# Patient Record
Sex: Male | Born: 1955 | Race: White | Hispanic: No | Marital: Married | Smoking: Current every day smoker
Health system: Southern US, Community
[De-identification: ages and names within clinical notes are randomized; demographics above are authoritative.]

## PROBLEM LIST (undated history)

## (undated) DIAGNOSIS — E039 Hypothyroidism, unspecified: Secondary | ICD-10-CM

## (undated) DIAGNOSIS — M1990 Unspecified osteoarthritis, unspecified site: Secondary | ICD-10-CM

## (undated) HISTORY — PX: OTHER SURGICAL HISTORY: SHX169

## (undated) HISTORY — PX: CHOLECYSTECTOMY: SHX55

---

## 1998-11-13 ENCOUNTER — Ambulatory Visit (HOSPITAL_BASED_OUTPATIENT_CLINIC_OR_DEPARTMENT_OTHER): Admission: RE | Admit: 1998-11-13 | Discharge: 1998-11-13 | Payer: Self-pay | Admitting: Orthopedic Surgery

## 2005-05-28 ENCOUNTER — Emergency Department (HOSPITAL_COMMUNITY): Admission: EM | Admit: 2005-05-28 | Discharge: 2005-05-29 | Payer: Self-pay | Admitting: Emergency Medicine

## 2005-06-12 ENCOUNTER — Ambulatory Visit (HOSPITAL_COMMUNITY): Admission: RE | Admit: 2005-06-12 | Discharge: 2005-06-12 | Payer: Self-pay | Admitting: Cardiology

## 2005-11-17 ENCOUNTER — Emergency Department (HOSPITAL_COMMUNITY): Admission: EM | Admit: 2005-11-17 | Discharge: 2005-11-17 | Payer: Self-pay | Admitting: Emergency Medicine

## 2005-12-21 ENCOUNTER — Ambulatory Visit (HOSPITAL_COMMUNITY): Admission: RE | Admit: 2005-12-21 | Discharge: 2005-12-22 | Payer: Self-pay | Admitting: General Surgery

## 2011-09-07 ENCOUNTER — Other Ambulatory Visit: Payer: Self-pay | Admitting: Critical Care Medicine

## 2011-09-07 DIAGNOSIS — J90 Pleural effusion, not elsewhere classified: Secondary | ICD-10-CM

## 2011-09-10 ENCOUNTER — Other Ambulatory Visit (HOSPITAL_COMMUNITY): Payer: Self-pay

## 2013-12-20 ENCOUNTER — Other Ambulatory Visit (HOSPITAL_COMMUNITY): Payer: Self-pay | Admitting: Orthopaedic Surgery

## 2013-12-22 ENCOUNTER — Encounter (HOSPITAL_COMMUNITY): Payer: Self-pay | Admitting: Pharmacy Technician

## 2013-12-27 ENCOUNTER — Encounter (HOSPITAL_COMMUNITY)
Admission: RE | Admit: 2013-12-27 | Discharge: 2013-12-27 | Disposition: A | Discharge status: Home / Self Care | Payer: BC Managed Care – PPO | Source: Ambulatory Visit | Attending: Orthopaedic Surgery | Admitting: Orthopaedic Surgery

## 2013-12-27 ENCOUNTER — Encounter (INDEPENDENT_AMBULATORY_CARE_PROVIDER_SITE_OTHER): Payer: Self-pay

## 2013-12-27 ENCOUNTER — Encounter (HOSPITAL_COMMUNITY): Payer: Self-pay

## 2013-12-27 HISTORY — DX: Hypothyroidism, unspecified: E03.9

## 2013-12-27 HISTORY — DX: Unspecified osteoarthritis, unspecified site: M19.90

## 2013-12-27 LAB — URINALYSIS, ROUTINE W REFLEX MICROSCOPIC
Bilirubin Urine: NEGATIVE
Glucose, UA: NEGATIVE mg/dL
Ketones, ur: NEGATIVE mg/dL
LEUKOCYTES UA: NEGATIVE
NITRITE: NEGATIVE
Protein, ur: NEGATIVE mg/dL
SPECIFIC GRAVITY, URINE: 1.015 (ref 1.005–1.030)
UROBILINOGEN UA: 0.2 mg/dL (ref 0.0–1.0)
pH: 6 (ref 5.0–8.0)

## 2013-12-27 LAB — CBC
HCT: 47.9 % (ref 39.0–52.0)
Hemoglobin: 16.6 g/dL (ref 13.0–17.0)
MCH: 33.5 pg (ref 26.0–34.0)
MCHC: 34.7 g/dL (ref 30.0–36.0)
MCV: 96.6 fL (ref 78.0–100.0)
PLATELETS: 230 10*3/uL (ref 150–400)
RBC: 4.96 MIL/uL (ref 4.22–5.81)
RDW: 14.3 % (ref 11.5–15.5)
WBC: 10.1 10*3/uL (ref 4.0–10.5)

## 2013-12-27 LAB — URINE MICROSCOPIC-ADD ON

## 2013-12-27 LAB — PROTIME-INR
INR: 1.01 (ref 0.00–1.49)
PROTHROMBIN TIME: 13.1 s (ref 11.6–15.2)

## 2013-12-27 LAB — SURGICAL PCR SCREEN
MRSA, PCR: NEGATIVE
STAPHYLOCOCCUS AUREUS: NEGATIVE

## 2013-12-27 LAB — APTT: APTT: 29 s (ref 24–37)

## 2013-12-27 LAB — BASIC METABOLIC PANEL
BUN: 14 mg/dL (ref 6–23)
CALCIUM: 9.6 mg/dL (ref 8.4–10.5)
CHLORIDE: 100 meq/L (ref 96–112)
CO2: 26 mEq/L (ref 19–32)
CREATININE: 1.02 mg/dL (ref 0.50–1.35)
GFR calc non Af Amer: 80 mL/min — ABNORMAL LOW (ref >90)
Glucose, Bld: 88 mg/dL (ref 70–99)
Potassium: 4.6 mEq/L (ref 3.7–5.3)
SODIUM: 136 meq/L — AB (ref 137–147)

## 2013-12-27 LAB — ABO/RH: ABO/RH(D): A POS

## 2013-12-27 NOTE — Pre-Procedure Instructions (Signed)
EKG AND CXR WERE NOT NEEDED PREOP PER ANESTHESIOLOGIST'S GUIDELINES. 

## 2013-12-27 NOTE — Patient Instructions (Addendum)
[facility]  ON:  Friday  1/16   REPORT TO  SHORT STAY CENTER AT:  12:45 PM      PHONE # FOR SHORT STAY IS [phone]  DO NOT EAT ANYTHING AFTER MIDNIGHT THE NIGHT BEFORE YOUR SURGERY.  YOU MAY BRUSH YOUR TEETH, RINSE OUT YOUR MOUTH-- NO FOOD, NO CHEWING GUM, NO MINTS, NO CANDIES, NO CHEWING TOBACCO. YOU MAY HAVE CLEAR LIQUIDS TO DRINK FROM MIDNIGHT UNTIL 9:15 AM DAY OF SURGERY - LIKE WATER, COFFEE ( NO MILK OR MILK PRODUCTS ).  NOTHING TO DRINK AFTER 9:15 AM THE DAY OF SURGERY.  PLEASE TAKE THE FOLLOWING MEDICATIONS THE AM OF YOUR SURGERY WITH A FEW SIPS OF WATER:  LEVOTHYROXINE, HYDROCODONE / ACETAMINOPHEN FOR PAIN IF NEEDED.   DO NOT BRING VALUABLES, MONEY, CREDIT CARDS.  DO NOT WEAR JEWELRY, MAKE-UP, NAIL POLISH AND NO METAL PINS OR CLIPS IN YOUR HAIR. CONTACT LENS, DENTURES / PARTIALS, GLASSES SHOULD NOT BE WORN TO SURGERY AND IN MOST CASES-HEARING AIDS WILL NEED TO BE REMOVED.  BRING YOUR GLASSES CASE, ANY EQUIPMENT NEEDED FOR YOUR CONTACT LENS. FOR PATIENTS ADMITTED TO THE HOSPITAL--CHECK OUT TIME THE DAY OF DISCHARGE IS 11:00 AM.  ALL INPATIENT ROOMS ARE PRIVATE - WITH BATHROOM, TELEPHONE, TELEVISION AND WIFI INTERNET.                                                     PLEASE READ OVER ANY  FACT SHEETS THAT YOU WERE GIVEN: MRSA INFORMATION, BLOOD TRANSFUSION INFORMATION.  FAILURE TO FOLLOW THESE INSTRUCTIONS MAY RESULT IN THE CANCELLATION OF YOUR SURGERY. PLEASE BE AWARE THAT YOU MAY NEED ADDITIONAL BLOOD DRAWN DAY OF YOUR SURGERY  PATIENT SIGNATURE_________________________________

## 2013-12-29 ENCOUNTER — Encounter (HOSPITAL_COMMUNITY): Payer: BC Managed Care – PPO | Admitting: Anesthesiology

## 2013-12-29 ENCOUNTER — Encounter (HOSPITAL_COMMUNITY): Payer: Self-pay | Admitting: *Deleted

## 2013-12-29 ENCOUNTER — Inpatient Hospital Stay (HOSPITAL_COMMUNITY): Payer: BC Managed Care – PPO

## 2013-12-29 ENCOUNTER — Inpatient Hospital Stay (HOSPITAL_COMMUNITY)
Admission: RE | Admit: 2013-12-29 | Discharge: 2013-12-31 | DRG: 470 | Disposition: A | Discharge status: Home Health | Payer: BC Managed Care – PPO | Source: Ambulatory Visit | Attending: Orthopaedic Surgery | Admitting: Orthopaedic Surgery

## 2013-12-29 ENCOUNTER — Inpatient Hospital Stay (HOSPITAL_COMMUNITY): Payer: BC Managed Care – PPO | Admitting: Anesthesiology

## 2013-12-29 ENCOUNTER — Encounter (HOSPITAL_COMMUNITY)
Admission: RE | Disposition: A | Discharge status: Home Health | Payer: Self-pay | Source: Ambulatory Visit | Attending: Orthopaedic Surgery

## 2013-12-29 DIAGNOSIS — Z96659 Presence of unspecified artificial knee joint: Secondary | ICD-10-CM

## 2013-12-29 DIAGNOSIS — Z01812 Encounter for preprocedural laboratory examination: Secondary | ICD-10-CM

## 2013-12-29 DIAGNOSIS — M179 Osteoarthritis of knee, unspecified: Secondary | ICD-10-CM

## 2013-12-29 DIAGNOSIS — E039 Hypothyroidism, unspecified: Secondary | ICD-10-CM | POA: Diagnosis present

## 2013-12-29 DIAGNOSIS — M12569 Traumatic arthropathy, unspecified knee: Secondary | ICD-10-CM | POA: Diagnosis present

## 2013-12-29 DIAGNOSIS — M1710 Unilateral primary osteoarthritis, unspecified knee: Secondary | ICD-10-CM

## 2013-12-29 DIAGNOSIS — F17200 Nicotine dependence, unspecified, uncomplicated: Secondary | ICD-10-CM | POA: Diagnosis present

## 2013-12-29 HISTORY — PX: TOTAL KNEE ARTHROPLASTY: SHX125

## 2013-12-29 LAB — TYPE AND SCREEN
ABO/RH(D): A POS
ANTIBODY SCREEN: NEGATIVE

## 2013-12-29 SURGERY — ARTHROPLASTY, KNEE, TOTAL
Anesthesia: Spinal | Site: Knee | Laterality: Left

## 2013-12-29 MED ORDER — ASPIRIN EC 325 MG PO TBEC
325.0000 mg | DELAYED_RELEASE_TABLET | Freq: Two times a day (BID) | ORAL | Status: DC
  Administered 2013-12-30 – 2013-12-31 (×3): 325 mg via ORAL
  Filled 2013-12-29 (×5): qty 1

## 2013-12-29 MED ORDER — PROPOFOL 10 MG/ML IV BOLUS
INTRAVENOUS | Status: AC
  Filled 2013-12-29: qty 20

## 2013-12-29 MED ORDER — ROPIVACAINE HCL 5 MG/ML IJ SOLN
INTRAMUSCULAR | Status: AC
  Filled 2013-12-29: qty 30

## 2013-12-29 MED ORDER — MENTHOL 3 MG MT LOZG
1.0000 | LOZENGE | OROMUCOSAL | Status: DC | PRN
  Filled 2013-12-29: qty 9

## 2013-12-29 MED ORDER — HYDROMORPHONE HCL PF 1 MG/ML IJ SOLN
1.0000 mg | INTRAMUSCULAR | Status: DC | PRN
  Administered 2013-12-29 – 2013-12-30 (×4): 1 mg via INTRAVENOUS
  Filled 2013-12-29 (×4): qty 1

## 2013-12-29 MED ORDER — BUPIVACAINE HCL (PF) 0.75 % IJ SOLN
INTRAMUSCULAR | Status: DC | PRN
  Administered 2013-12-29: 15 mg via INTRATHECAL

## 2013-12-29 MED ORDER — SODIUM CHLORIDE 0.9 % IR SOLN
Status: DC | PRN
  Administered 2013-12-29: 4000 mL

## 2013-12-29 MED ORDER — GLYCOPYRROLATE 0.2 MG/ML IJ SOLN
INTRAMUSCULAR | Status: AC
  Filled 2013-12-29: qty 3

## 2013-12-29 MED ORDER — ACETAMINOPHEN 325 MG PO TABS: 650.0000 mg | ORAL_TABLET | Freq: Four times a day (QID) | ORAL | Status: DC | PRN

## 2013-12-29 MED ORDER — CEFAZOLIN SODIUM-DEXTROSE 2-3 GM-% IV SOLR
2.0000 g | INTRAVENOUS | Status: AC
  Administered 2013-12-29: 2 g via INTRAVENOUS

## 2013-12-29 MED ORDER — HYDROMORPHONE HCL PF 1 MG/ML IJ SOLN
0.2500 mg | INTRAMUSCULAR | Status: DC | PRN
  Administered 2013-12-29: 0.5 mg via INTRAVENOUS

## 2013-12-29 MED ORDER — METHOCARBAMOL 500 MG PO TABS
500.0000 mg | ORAL_TABLET | Freq: Four times a day (QID) | ORAL | Status: DC | PRN
  Administered 2013-12-30 – 2013-12-31 (×4): 500 mg via ORAL
  Filled 2013-12-29 (×4): qty 1

## 2013-12-29 MED ORDER — PHENOL 1.4 % MT LIQD: 1.0000 | OROMUCOSAL | Status: DC | PRN

## 2013-12-29 MED ORDER — MIDAZOLAM HCL 2 MG/2ML IJ SOLN
INTRAMUSCULAR | Status: AC
  Filled 2013-12-29: qty 2

## 2013-12-29 MED ORDER — LACTATED RINGERS IV SOLN
INTRAVENOUS | Status: DC | PRN
  Administered 2013-12-29 (×2): via INTRAVENOUS

## 2013-12-29 MED ORDER — FENTANYL CITRATE 0.05 MG/ML IJ SOLN
INTRAMUSCULAR | Status: DC | PRN
  Administered 2013-12-29: 50 ug via INTRAVENOUS
  Administered 2013-12-29 (×4): 25 ug via INTRAVENOUS
  Administered 2013-12-29 (×2): 50 ug via INTRAVENOUS

## 2013-12-29 MED ORDER — POLYETHYLENE GLYCOL 3350 17 G PO PACK
17.0000 g | PACK | Freq: Every day | ORAL | Status: DC | PRN
  Administered 2013-12-30: 17 g via ORAL

## 2013-12-29 MED ORDER — CEFAZOLIN SODIUM-DEXTROSE 2-3 GM-% IV SOLR
INTRAVENOUS | Status: AC
  Filled 2013-12-29: qty 50

## 2013-12-29 MED ORDER — LACTATED RINGERS IV SOLN: INTRAVENOUS | Status: DC

## 2013-12-29 MED ORDER — KETAMINE HCL 10 MG/ML IJ SOLN
INTRAMUSCULAR | Status: DC | PRN
  Administered 2013-12-29: 10 mg via INTRAVENOUS
  Administered 2013-12-29: 15 mg via INTRAVENOUS
  Administered 2013-12-29: 10 mg via INTRAVENOUS

## 2013-12-29 MED ORDER — 0.9 % SODIUM CHLORIDE (POUR BTL) OPTIME
TOPICAL | Status: DC | PRN
  Administered 2013-12-29: 1000 mL

## 2013-12-29 MED ORDER — METOCLOPRAMIDE HCL 5 MG/ML IJ SOLN: 5.0000 mg | Freq: Three times a day (TID) | INTRAMUSCULAR | Status: DC | PRN

## 2013-12-29 MED ORDER — ACETAMINOPHEN 650 MG RE SUPP: 650.0000 mg | Freq: Four times a day (QID) | RECTAL | Status: DC | PRN

## 2013-12-29 MED ORDER — EPHEDRINE SULFATE 50 MG/ML IJ SOLN
INTRAMUSCULAR | Status: AC
  Filled 2013-12-29: qty 1

## 2013-12-29 MED ORDER — LEVOTHYROXINE SODIUM 200 MCG PO TABS
200.0000 ug | ORAL_TABLET | Freq: Every day | ORAL | Status: DC
  Administered 2013-12-30 – 2013-12-31 (×2): 200 ug via ORAL
  Filled 2013-12-29 (×3): qty 1

## 2013-12-29 MED ORDER — OXYCODONE HCL ER 10 MG PO T12A
10.0000 mg | EXTENDED_RELEASE_TABLET | Freq: Two times a day (BID) | ORAL | Status: DC
  Administered 2013-12-30 – 2013-12-31 (×3): 10 mg via ORAL
  Filled 2013-12-29 (×3): qty 1

## 2013-12-29 MED ORDER — ONDANSETRON HCL 4 MG/2ML IJ SOLN
INTRAMUSCULAR | Status: DC | PRN
  Administered 2013-12-29 (×2): 2 mg via INTRAVENOUS

## 2013-12-29 MED ORDER — PROPOFOL INFUSION 10 MG/ML OPTIME
INTRAVENOUS | Status: DC | PRN
  Administered 2013-12-29: 100 ug/kg/min via INTRAVENOUS

## 2013-12-29 MED ORDER — HYDROMORPHONE HCL PF 1 MG/ML IJ SOLN
INTRAMUSCULAR | Status: AC
  Administered 2013-12-30: 1 mg via INTRAVENOUS
  Filled 2013-12-29: qty 1

## 2013-12-29 MED ORDER — METHOCARBAMOL 100 MG/ML IJ SOLN
500.0000 mg | Freq: Four times a day (QID) | INTRAVENOUS | Status: DC | PRN
  Administered 2013-12-29: 500 mg via INTRAVENOUS
  Filled 2013-12-29: qty 5

## 2013-12-29 MED ORDER — MIDAZOLAM HCL 5 MG/5ML IJ SOLN
INTRAMUSCULAR | Status: DC | PRN
  Administered 2013-12-29 (×4): 1 mg via INTRAVENOUS

## 2013-12-29 MED ORDER — CEFAZOLIN SODIUM 1-5 GM-% IV SOLN
1.0000 g | Freq: Four times a day (QID) | INTRAVENOUS | Status: AC
  Administered 2013-12-29 – 2013-12-30 (×2): 1 g via INTRAVENOUS
  Filled 2013-12-29 (×2): qty 50

## 2013-12-29 MED ORDER — EPHEDRINE SULFATE 50 MG/ML IJ SOLN
INTRAMUSCULAR | Status: DC | PRN
  Administered 2013-12-29 (×2): 5 mg via INTRAVENOUS

## 2013-12-29 MED ORDER — FENTANYL CITRATE 0.05 MG/ML IJ SOLN
INTRAMUSCULAR | Status: AC
  Filled 2013-12-29: qty 5

## 2013-12-29 MED ORDER — LACTATED RINGERS IV SOLN
INTRAVENOUS | Status: DC
  Administered 2013-12-29: 1000 mL via INTRAVENOUS

## 2013-12-29 MED ORDER — DOCUSATE SODIUM 100 MG PO CAPS
100.0000 mg | ORAL_CAPSULE | Freq: Two times a day (BID) | ORAL | Status: DC
  Administered 2013-12-29 – 2013-12-31 (×4): 100 mg via ORAL

## 2013-12-29 MED ORDER — SODIUM CHLORIDE 0.9 % IJ SOLN
INTRAMUSCULAR | Status: AC
  Filled 2013-12-29: qty 10

## 2013-12-29 MED ORDER — ONDANSETRON HCL 4 MG/2ML IJ SOLN
INTRAMUSCULAR | Status: AC
  Filled 2013-12-29: qty 2

## 2013-12-29 MED ORDER — LIDOCAINE HCL (CARDIAC) 20 MG/ML IV SOLN
INTRAVENOUS | Status: AC
  Filled 2013-12-29: qty 5

## 2013-12-29 MED ORDER — ALUM & MAG HYDROXIDE-SIMETH 200-200-20 MG/5ML PO SUSP: 30.0000 mL | ORAL | Status: DC | PRN

## 2013-12-29 MED ORDER — ROPIVACAINE HCL 5 MG/ML IJ SOLN
INTRAMUSCULAR | Status: DC | PRN
  Administered 2013-12-29: 30 mL via PERINEURAL

## 2013-12-29 MED ORDER — ONDANSETRON HCL 4 MG PO TABS: 4.0000 mg | ORAL_TABLET | Freq: Four times a day (QID) | ORAL | Status: DC | PRN

## 2013-12-29 MED ORDER — KETAMINE HCL 10 MG/ML IJ SOLN
INTRAMUSCULAR | Status: AC
  Filled 2013-12-29: qty 1

## 2013-12-29 MED ORDER — SODIUM CHLORIDE 0.9 % IV SOLN
INTRAVENOUS | Status: DC
  Administered 2013-12-30 (×3): via INTRAVENOUS

## 2013-12-29 MED ORDER — DIPHENHYDRAMINE HCL 12.5 MG/5ML PO ELIX: 12.5000 mg | ORAL_SOLUTION | ORAL | Status: DC | PRN

## 2013-12-29 MED ORDER — OXYCODONE HCL 5 MG PO TABS
5.0000 mg | ORAL_TABLET | ORAL | Status: DC | PRN
  Administered 2013-12-30 – 2013-12-31 (×7): 10 mg via ORAL
  Filled 2013-12-29 (×7): qty 2

## 2013-12-29 MED ORDER — METOCLOPRAMIDE HCL 10 MG PO TABS: 5.0000 mg | ORAL_TABLET | Freq: Three times a day (TID) | ORAL | Status: DC | PRN

## 2013-12-29 MED ORDER — ONDANSETRON HCL 4 MG/2ML IJ SOLN
4.0000 mg | Freq: Four times a day (QID) | INTRAMUSCULAR | Status: DC | PRN
Start: 2013-12-29 — End: 2013-12-31

## 2013-12-29 MED ORDER — ZOLPIDEM TARTRATE 5 MG PO TABS: 5.0000 mg | ORAL_TABLET | Freq: Every evening | ORAL | Status: DC | PRN

## 2013-12-29 SURGICAL SUPPLY — 69 items
BAG ZIPLOCK 12X15 (MISCELLANEOUS) IMPLANT
BANDAGE ELASTIC 6 VELCRO ST LF (GAUZE/BANDAGES/DRESSINGS) ×6 IMPLANT
BANDAGE ESMARK 6X9 LF (GAUZE/BANDAGES/DRESSINGS) ×1 IMPLANT
BLADE SAG 18X100X1.27 (BLADE) ×3 IMPLANT
BLADE SAGITTAL 25.0X1.37X90 (BLADE) ×2 IMPLANT
BLADE SAGITTAL 25.0X1.37X90MM (BLADE) ×1
BNDG ESMARK 6X9 LF (GAUZE/BANDAGES/DRESSINGS) ×3
BOWL SMART MIX CTS (DISPOSABLE) ×3 IMPLANT
CAP KNEE OXINIUM POROUS CONSTR ×3 IMPLANT
CEMENT BONE 1-PACK (Cement) ×6 IMPLANT
CUFF TOURN SGL QUICK 34 (TOURNIQUET CUFF) ×2
CUFF TRNQT CYL 34X4X40X1 (TOURNIQUET CUFF) ×1 IMPLANT
DRAPE EXTREMITY T 121X128X90 (DRAPE) ×3 IMPLANT
DRAPE LG THREE QUARTER DISP (DRAPES) ×3 IMPLANT
DRAPE POUCH INSTRU U-SHP 10X18 (DRAPES) ×3 IMPLANT
DRAPE U-SHAPE 47X51 STRL (DRAPES) ×3 IMPLANT
DRSG AQUACEL AG ADV 3.5X10 (GAUZE/BANDAGES/DRESSINGS) IMPLANT
DRSG TEGADERM 4X4.75 (GAUZE/BANDAGES/DRESSINGS) IMPLANT
DURAPREP 26ML APPLICATOR (WOUND CARE) ×3 IMPLANT
ELECT REM PT RETURN 9FT ADLT (ELECTROSURGICAL) ×3
ELECTRODE REM PT RTRN 9FT ADLT (ELECTROSURGICAL) ×1 IMPLANT
EVACUATOR 1/8 PVC DRAIN (DRAIN) ×3 IMPLANT
FACESHIELD LNG OPTICON STERILE (SAFETY) ×15 IMPLANT
GAUZE SPONGE 2X2 8PLY STRL LF (GAUZE/BANDAGES/DRESSINGS) IMPLANT
GAUZE XEROFORM 1X8 LF (GAUZE/BANDAGES/DRESSINGS) ×6 IMPLANT
GLOVE BIO SURGEON STRL SZ7.5 (GLOVE) ×3 IMPLANT
GLOVE BIOGEL PI IND STRL 7.0 (GLOVE) ×1 IMPLANT
GLOVE BIOGEL PI IND STRL 8 (GLOVE) ×3 IMPLANT
GLOVE BIOGEL PI INDICATOR 7.0 (GLOVE) ×2
GLOVE BIOGEL PI INDICATOR 8 (GLOVE) ×6
GLOVE ECLIPSE 8.0 STRL XLNG CF (GLOVE) ×3 IMPLANT
GLOVE SURG SS PI 6.5 STRL IVOR (GLOVE) ×3 IMPLANT
GLOVE SURG SS PI 7.5 STRL IVOR (GLOVE) ×6 IMPLANT
GLOVE SURG SS PI 8.5 STRL IVOR (GLOVE) ×6
GLOVE SURG SS PI 8.5 STRL STRW (GLOVE) ×3 IMPLANT
GOWN STRL REIN 3XL LVL4 (GOWN DISPOSABLE) ×3 IMPLANT
GOWN STRL REUS W/ TWL LRG LVL4 (GOWN DISPOSABLE) ×1 IMPLANT
GOWN STRL REUS W/TWL LRG LVL4 (GOWN DISPOSABLE) ×2
GOWN STRL REUS W/TWL XL LVL3 (GOWN DISPOSABLE) ×12 IMPLANT
HANDPIECE INTERPULSE COAX TIP (DISPOSABLE) ×2
IMMOBILIZER KNEE 20 (SOFTGOODS) ×3 IMPLANT
IV NS IRRIG 3000ML ARTHROMATIC (IV SOLUTION) ×3 IMPLANT
KIT BASIN OR (CUSTOM PROCEDURE TRAY) ×3 IMPLANT
NEEDLE HYPO 21X1.5 SAFETY (NEEDLE) IMPLANT
NS IRRIG 1000ML POUR BTL (IV SOLUTION) ×3 IMPLANT
PACK TOTAL JOINT (CUSTOM PROCEDURE TRAY) ×3 IMPLANT
PAD ABD 8X10 STRL (GAUZE/BANDAGES/DRESSINGS) ×3 IMPLANT
PADDING CAST COTTON 6X4 STRL (CAST SUPPLIES) ×6 IMPLANT
PIN TROCAR 3 INCH (PIN) ×24 IMPLANT
POSITIONER SURGICAL ARM (MISCELLANEOUS) ×3 IMPLANT
SET HNDPC FAN SPRY TIP SCT (DISPOSABLE) ×1 IMPLANT
SET PAD KNEE POSITIONER (MISCELLANEOUS) ×3 IMPLANT
SPONGE GAUZE 2X2 STER 10/PKG (GAUZE/BANDAGES/DRESSINGS)
SPONGE GAUZE 4X4 12PLY (GAUZE/BANDAGES/DRESSINGS) ×6 IMPLANT
STAPLER VISISTAT 35W (STAPLE) ×3 IMPLANT
SUCTION FRAZIER 12FR DISP (SUCTIONS) ×3 IMPLANT
SUT MNCRL AB 4-0 PS2 18 (SUTURE) IMPLANT
SUT VIC AB 0 CT1 27 (SUTURE)
SUT VIC AB 0 CT1 27XBRD ANTBC (SUTURE) IMPLANT
SUT VIC AB 1 CT1 27 (SUTURE) ×8
SUT VIC AB 1 CT1 27XBRD ANTBC (SUTURE) ×4 IMPLANT
SUT VIC AB 2-0 CT1 27 (SUTURE) ×4
SUT VIC AB 2-0 CT1 TAPERPNT 27 (SUTURE) ×2 IMPLANT
SYR 50ML LL SCALE MARK (SYRINGE) IMPLANT
TOWEL OR 17X26 10 PK STRL BLUE (TOWEL DISPOSABLE) ×3 IMPLANT
TOWEL OR NON WOVEN STRL DISP B (DISPOSABLE) ×3 IMPLANT
TRAY FOLEY CATH 16FR SILVER (SET/KITS/TRAYS/PACK) ×3 IMPLANT
WATER STERILE IRR 1500ML POUR (IV SOLUTION) ×3 IMPLANT
WRAP KNEE MAXI GEL POST OP (GAUZE/BANDAGES/DRESSINGS) ×3 IMPLANT

## 2013-12-29 NOTE — Anesthesia Postprocedure Evaluation (Signed)
  Anesthesia Post-op Note  Patient: Luke Meyer  Procedure(s) Performed: Procedure(s) (LRB): LEFT TOTAL KNEE ARTHROPLASTY (Left)  Patient Location: PACU  Anesthesia Type: Spinal  Level of Consciousness: awake and alert   Airway and Oxygen Therapy: Patient Spontanous Breathing  Post-op Pain: mild  Post-op Assessment: Post-op Vital signs reviewed, Patient's Cardiovascular Status Stable, Respiratory Function Stable, Patent Airway and No signs of Nausea or vomiting  Last Vitals:  Filed Vitals:   12/29/13 1915  BP:   Pulse:   Temp: 36.7 C  Resp:     Post-op Vital Signs: stable   Complications: No apparent anesthesia complications

## 2013-12-29 NOTE — Anesthesia Preprocedure Evaluation (Signed)
Anesthesia Evaluation  Patient identified by MRN, date of birth, ID band Patient awake    Reviewed: Allergy & Precautions, H&P , NPO status , Patient's Chart, lab work & pertinent test results  Airway Mallampati: II TM Distance: >3 FB Neck ROM: full    Dental  (+) Caps and Dental Advisory Given Caps and bonding on both front upper teeth and a couple of other front teeth:   Pulmonary neg pulmonary ROS, Current Smoker,  breath sounds clear to auscultation  Pulmonary exam normal       Cardiovascular Exercise Tolerance: Good negative cardio ROS  Rhythm:regular Rate:Normal     Neuro/Psych negative neurological ROS  negative psych ROS   GI/Hepatic negative GI ROS, Neg liver ROS,   Endo/Other  negative endocrine ROSHypothyroidism   Renal/GU negative Renal ROS  negative genitourinary   Musculoskeletal   Abdominal   Peds  Hematology negative hematology ROS (+)   Anesthesia Other Findings   Reproductive/Obstetrics negative OB ROS                           Anesthesia Physical Anesthesia Plan  ASA: II  Anesthesia Plan: Spinal   Post-op Pain Management: MAC Combined w/ Regional for Post-op pain   Induction:   Airway Management Planned: Simple Face Mask  Additional Equipment:   Intra-op Plan:   Post-operative Plan:   Informed Consent: I have reviewed the patients History and Physical, chart, labs and discussed the procedure including the risks, benefits and alternatives for the proposed anesthesia with the patient or authorized representative who has indicated his/her understanding and acceptance.   Dental Advisory Given  Plan Discussed with: CRNA and Surgeon  Anesthesia Plan Comments:         Anesthesia Quick Evaluation

## 2013-12-29 NOTE — Brief Op Note (Signed)
12/29/2013  6:00 PM  PATIENT:  Luke Meyer  58 y.o. male  PRE-OPERATIVE DIAGNOSIS:  Severe osteoarthritis left knee  POST-OPERATIVE DIAGNOSIS:  Severe osteoarthritis left knee  PROCEDURE:  Procedure(s): LEFT TOTAL KNEE ARTHROPLASTY (Left)  SURGEON:  Surgeon(s) and Role:    * Kathryne Hitchhristopher Y Khandi Kernes, MD - Primary  PHYSICIAN ASSISTANT: Rexene EdisonGil Clark, PA-C  ANESTHESIA:   spinal  EBL:  Total I/O In: 1000 [I.V.:1000] Out: 850 [Urine:700; Blood:150]  BLOOD ADMINISTERED:none  DRAINS: (Medium) Hemovact drain(s) in the knee joint with  Suction Open   LOCAL MEDICATIONS USED:  NONE  SPECIMEN:  No Specimen  DISPOSITION OF SPECIMEN:  N/A  COUNTS:  YES  TOURNIQUET:   Total Tourniquet Time Documented: Thigh (Left) - 85 minutes Total: Thigh (Left) - 85 minutes   DICTATION: .Other Dictation: Dictation Number [id number]  PLAN OF CARE: Admit to inpatient   PATIENT DISPOSITION:  PACU - hemodynamically stable.   Delay start of Pharmacological VTE agent (>24hrs) due to surgical blood loss or risk of bleeding: no

## 2013-12-29 NOTE — Transfer of Care (Signed)
Immediate Anesthesia Transfer of Care Note  Patient: Luke Meyer  Procedure(s) Performed: Procedure(s): LEFT TOTAL KNEE ARTHROPLASTY (Left)  Patient Location: PACU  Anesthesia Type:Regional  Level of Consciousness: awake, alert , oriented and patient cooperative  Airway & Oxygen Therapy: Patient Spontanous Breathing and Patient connected to face mask oxygen  Post-op Assessment: Report given to PACU RN and Post -op Vital signs reviewed and stable  Post vital signs: stable  Complications: No apparent anesthesia complications

## 2013-12-29 NOTE — Anesthesia Procedure Notes (Addendum)
Anesthesia Regional Block:  Femoral nerve block  Pre-Anesthetic Checklist: ,, timeout performed, Correct Patient, Correct Site, Correct Laterality, Correct Procedure, Correct Position, site marked, Risks and benefits discussed,  Surgical consent,  Pre-op evaluation,  At surgeon's request and post-op pain management  Laterality: Left  Prep: chloraprep       Needles:  Injection technique: Single-shot  Needle Type: Echogenic Stimulator Needle      Needle Gauge: 20 and 20 G    Additional Needles:  Procedures: ultrasound guided (picture in chart) and nerve stimulator Femoral nerve block  Nerve Stimulator or Paresthesia:  Response: 0.5 mA,   Additional Responses:   Narrative:  Start time: 12/29/2013 2:35 PM End time: 12/29/2013 2:45 PM Injection made incrementally with aspirations every 5 mL.  Performed by: Personally  Anesthesiologist: Peyton Najjar MD  Additional Notes: Patient tolerated the procedure well without complications   Spinal  Patient location during procedure: OR Start time: 12/29/2013 3:30 PM End time: 12/29/2013 3:35 PM Staffing Anesthesiologist: Rod Mae L Performed by: anesthesiologist  Preanesthetic Checklist Completed: patient identified, site marked, surgical consent, pre-op evaluation, timeout performed, IV checked, risks and benefits discussed and monitors and equipment checked Spinal Block Patient position: sitting Prep: Betadine Patient monitoring: heart rate, continuous pulse ox and blood pressure Location: L2-3 Injection technique: single-shot Needle Needle type: Spinocan and Whitacre  Needle gauge: 25 G Needle length: 9 cm Assessment Sensory level: T6 Additional Notes Expiration date of kit checked and confirmed. Patient tolerated procedure well, without complications.
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
## 2013-12-30 LAB — BASIC METABOLIC PANEL
BUN: 11 mg/dL (ref 6–23)
CALCIUM: 8.2 mg/dL — AB (ref 8.4–10.5)
CO2: 23 meq/L (ref 19–32)
Chloride: 100 mEq/L (ref 96–112)
Creatinine, Ser: 1.05 mg/dL (ref 0.50–1.35)
GFR calc Af Amer: 89 mL/min — ABNORMAL LOW (ref >90)
GFR calc non Af Amer: 77 mL/min — ABNORMAL LOW (ref >90)
Glucose, Bld: 125 mg/dL — ABNORMAL HIGH (ref 70–99)
POTASSIUM: 4.4 meq/L (ref 3.7–5.3)
SODIUM: 135 meq/L — AB (ref 137–147)

## 2013-12-30 LAB — CBC
HCT: 37.6 % — ABNORMAL LOW (ref 39.0–52.0)
Hemoglobin: 12.5 g/dL — ABNORMAL LOW (ref 13.0–17.0)
MCH: 32.4 pg (ref 26.0–34.0)
MCHC: 33.2 g/dL (ref 30.0–36.0)
MCV: 97.4 fL (ref 78.0–100.0)
Platelets: 184 10*3/uL (ref 150–400)
RBC: 3.86 MIL/uL — AB (ref 4.22–5.81)
RDW: 14.2 % (ref 11.5–15.5)
WBC: 11 10*3/uL — ABNORMAL HIGH (ref 4.0–10.5)

## 2013-12-30 NOTE — Plan of Care (Signed)
Problem: Consults Goal: Diagnosis- Total Joint Replacement Outcome: Completed/Met Date Met:  12/30/13 Primary Total Knee LEFT

## 2013-12-30 NOTE — Progress Notes (Signed)
Physical Therapy Treatment Patient Details Name: Luke SimpsonRonald A Meyer MRN: [id number] DOB: 1956/05/14 Today's Date: 12/30/2013 Time: [id number] PT Time Calculation (min): 18 min  PT Assessment / Plan / Recommendation  History of Present Illness LTKA   PT Comments   Pt improving. Pt hopeful to DC tomorrow.  Follow Up Recommendations  Home health PT     Does the patient have the potential to tolerate intense rehabilitation     Barriers to Discharge        Equipment Recommendations  None recommended by PT    Recommendations for Other Services    Frequency 7X/week   Progress towards PT Goals Progress towards PT goals: Progressing toward goals  Plan Current plan remains appropriate    Precautions / Restrictions Precautions Precautions: Knee Required Braces or Orthoses: Knee Immobilizer - Left Knee Immobilizer - Left: Discontinue once straight leg raise with < 10 degree lag Restrictions Weight Bearing Restrictions: No   Pertinent Vitals/Pain 5-6 L knee    Mobility  Bed Mobility Overal bed mobility: Modified Independent Bed Mobility: Sit to Supine Supine to sit: Modified independent (Device/Increase time) (using sheet to help lift his leg) Sit to supine: Min assist General bed mobility comments: support of LLE Transfers Overall transfer level: Needs assistance Equipment used: Rolling walker (2 wheeled) Transfers: Sit to/from Stand Sit to Stand: Min guard General transfer comment: cues for UE position/leg position Ambulation/Gait Ambulation/Gait assistance: Min assist Ambulation Distance (Feet): 160 Feet Assistive device: Rolling walker (2 wheeled) Gait Pattern/deviations: Step-to pattern;Step-through pattern;Antalgic General Gait Details: cues for sequence.    Exercises Total Joint Exercises Heel Slides: AAROM;Left;10 reps;Supine Straight Leg Raises: AAROM;Left;10 reps;Supine   PT Diagnosis:    PT Problem List:   PT Treatment Interventions:     PT Goals (current  goals can now be found in the care plan section)    Visit Information  Last PT Received On: 12/30/13 Assistance Needed: +1 History of Present Illness: LTKA    Subjective Data      Cognition  Cognition Arousal/Alertness: Awake/alert Behavior During Therapy: WFL for tasks assessed/performed Overall Cognitive Status: Within Functional Limits for tasks assessed    Balance     End of Session PT - End of Session Equipment Utilized During Treatment: Left knee immobilizer Activity Tolerance: Patient tolerated treatment well Patient left: with call bell/phone within reach;in bed Nurse Communication: Mobility status   GP     Rada HayHill, Blimy Napoleon Elizabeth 12/30/2013, 4:54 PM

## 2013-12-30 NOTE — Evaluation (Signed)
Physical Therapy Evaluation Patient Details Name: Luke SimpsonRonald A Stamour MRN: [id number] DOB: 01/15/1956 Today's Date: 12/30/2013 Time: [id number] PT Time Calculation (min): 26 min  PT Assessment / Plan / Recommendation History of Present Illness  LTKA  Clinical Impression  Pt tolerated ambulation well. Pt will benefit from PT to address problems.   PT Assessment  Patient needs continued PT services    Follow Up Recommendations  Home health PT    Does the patient have the potential to tolerate intense rehabilitation      Barriers to Discharge        Equipment Recommendations  None recommended by PT    Recommendations for Other Services     Frequency 7X/week    Precautions / Restrictions Precautions Precautions: Knee Required Braces or Orthoses: Knee Immobilizer - Left Knee Immobilizer - Left: Discontinue once straight leg raise with < 10 degree lag   Pertinent Vitals/Pain 7 with exercises.     Mobility  Bed Mobility Overal bed mobility: Needs Assistance Bed Mobility: Supine to Sit Supine to sit: Min assist General bed mobility comments: support of LLE Transfers Overall transfer level: Needs assistance Equipment used: Rolling walker (2 wheeled) Transfers: Sit to/from Stand Sit to Stand: Min assist;From elevated surface General transfer comment: cues for UE position/leg position Ambulation/Gait Ambulation/Gait assistance: Min assist Ambulation Distance (Feet): 80 Feet Assistive device: Rolling walker (2 wheeled) Gait Pattern/deviations: Step-to pattern;Decreased step length - left;Decreased stance time - left    Exercises Total Joint Exercises Quad Sets: AROM;Left;10 reps;Supine Heel Slides: AAROM;Left;10 reps;Supine Straight Leg Raises: AAROM;Left;10 reps;Supine   PT Diagnosis: Difficulty walking;Acute pain  PT Problem List: Decreased strength;Decreased range of motion;Decreased activity tolerance;Decreased mobility;Decreased safety awareness;Decreased knowledge of  use of DME;Pain PT Treatment Interventions: DME instruction;Gait training;Functional mobility training;Therapeutic activities;Therapeutic exercise;Patient/family education     PT Goals(Current goals can be found in the care plan section) Acute Rehab PT Goals Patient Stated Goal: to walk today PT Goal Formulation: With patient/family Time For Goal Achievement: 01/06/14 Potential to Achieve Goals: Good  Visit Information  Last PT Received On: 12/30/13 Assistance Needed: +1 History of Present Illness: LTKA       Prior Functioning  Home Living Family/patient expects to be discharged to:: Private residence Living Arrangements: Spouse/significant other Available Help at Discharge: Family Type of Home: House Home Access: Level entry Home Layout: One level Home Equipment: Environmental consultantWalker - 2 wheels Prior Function Level of Independence: Independent Communication Communication: No difficulties    Cognition  Cognition Arousal/Alertness: Awake/alert Behavior During Therapy: WFL for tasks assessed/performed Overall Cognitive Status: Within Functional Limits for tasks assessed    Extremity/Trunk Assessment Upper Extremity Assessment Upper Extremity Assessment: Overall WFL for tasks assessed Lower Extremity Assessment Lower Extremity Assessment: LLE deficits/detail LLE Deficits / Details: requires assist for Leg lifts, flexion 50*   Balance    End of Session PT - End of Session Equipment Utilized During Treatment: Left knee immobilizer Activity Tolerance: Patient tolerated treatment well Patient left: in chair;with call bell/phone within reach Nurse Communication: Mobility status  GP     Rada HayHill, Mychael Smock Elizabeth 12/30/2013, 12:42 PM Blanchard KelchKaren Christapher Gillian PT [phone]
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
## 2013-12-30 NOTE — Evaluation (Signed)
Occupational Therapy Evaluation Patient Details Name: Luke Meyer MRN: [id number] DOB: 29-Sep-1956 Today's Date: 12/30/2013 Time: 4098-1191 OT Time Calculation (min): 26 min  OT Assessment / Plan / Recommendation History of present illness LTKA   Clinical Impression   This 58 yo male admitted and underwent above presents to acute OT with decreased AROM LLE, increased pain LLE, decreased standing balance--all affecting pt's ability to completely care for himself. Pt will benefit from continued acute OT without need for follow up.    OT Assessment  Patient needs continued OT Services    Follow Up Recommendations  No OT follow up       Equipment Recommendations  None recommended by OT       Frequency  Min 2X/week    Precautions / Restrictions Precautions Precautions: Knee Required Braces or Orthoses: Knee Immobilizer - Left Knee Immobilizer - Left: Discontinue once straight leg raise with < 10 degree lag Restrictions Weight Bearing Restrictions: No   Pertinent Vitals/Pain 5/10 with ambulation    ADL  Grooming: Wash/dry hands;Min guard Where Assessed - Grooming: Unsupported standing Toilet Transfer: Min Pension scheme manager Method: Sit to Barista: Regular height toilet (holding onto toliet pipe behind him (as if holding onto tank of toilet and grab (as if his sink at home)) Toileting - Architect and Hygiene: Min guard Where Assessed - Engineer, mining and Hygiene: Sit to stand from 3-in-1 or toilet Equipment Used: Gait belt;Rolling walker;Knee Immobilizer Transfers/Ambulation Related to ADLs: see mobility section ADL Comments: wife will A with his LBADLs until he can do them himself    OT Diagnosis: Generalized weakness;Acute pain  OT Problem List: Decreased range of motion;Decreased knowledge of use of DME or AE;Pain;Impaired balance (sitting and/or standing) OT Treatment Interventions: Self-care/ADL  training;Therapeutic activities;DME and/or AE instruction;Patient/family education;Balance training   OT Goals(Current goals can be found in the care plan section) Acute Rehab OT Goals Patient Stated Goal: to walk today OT Goal Formulation: With patient Time For Goal Achievement: 01/06/14 Potential to Achieve Goals: Good  Visit Information  Last OT Received On: 12/30/13 Assistance Needed: +1 History of Present Illness: LTKA       Prior Functioning     Home Living Family/patient expects to be discharged to:: Private residence Living Arrangements: Spouse/significant other Available Help at Discharge: Family Type of Home: House Home Access: Level entry Home Layout: One level Home Equipment: Environmental consultant - 2 wheels;Shower seat (shower seat has back) Prior Function Level of Independence: Independent Communication Communication: No difficulties Dominant Hand: Right         Vision/Perception Vision - History Patient Visual Report: No change from baseline   Cognition  Cognition Arousal/Alertness: Awake/alert Behavior During Therapy: WFL for tasks assessed/performed Overall Cognitive Status: Within Functional Limits for tasks assessed    Extremity/Trunk Assessment Upper Extremity Assessment Upper Extremity Assessment: Overall WFL for tasks assessed Lower Extremity Assessment Lower Extremity Assessment: LLE deficits/detail LLE Deficits / Details: requires assist for Leg lifts, flexion 50*     Mobility Bed Mobility Overal bed mobility: Modified Independent Bed Mobility: Supine to Sit Supine to sit: Modified independent (Device/Increase time) (using sheet to help lift his leg) Transfers Overall transfer level: Needs assistance Equipment used: Rolling walker (2 wheeled) Transfers: Sit to/from Stand Sit to Stand: Min guard           End of Session OT - End of Session Equipment Utilized During Treatment: Gait belt;Rolling walker;Left knee immobilizer Activity  Tolerance: Patient tolerated treatment well Patient left:  (  with PT walking in the hallway)       Evette GeorgesLeonard, Daneshia Tavano Eva [phone] 12/30/2013, 2:03 PM

## 2013-12-30 NOTE — Progress Notes (Signed)
Subjective: 1 Day Post-Op Procedure(s) (LRB): LEFT TOTAL KNEE ARTHROPLASTY (Left) Patient reports pain as moderate.    Objective: Vital signs in last 24 hours: Temp:  [97.6 F (36.4 C)-98 F (36.7 C)] 98 F (36.7 C) (01/17 0539) Pulse Rate:  [55-85] 84 (01/17 0100) Resp:  [9-18] 16 (01/17 0539) BP: (102-134)/(60-94) 108/73 mmHg (01/17 0539) SpO2:  [92 %-100 %] 92 % (01/17 0539) Weight:  [111.449 kg (245 lb 11.2 oz)] 111.449 kg (245 lb 11.2 oz) (01/16 1950)  Intake/Output from previous day: 01/16 0701 - 01/17 0700 In: 1675 [I.V.:1570; IV Piggyback:105] Out: 1470 [Urine:1275; Drains:45; Blood:150] Intake/Output this shift:     Recent Labs  12/27/13 0840 12/30/13 0501  HGB 16.6 12.5*    Recent Labs  12/27/13 0840 12/30/13 0501  WBC 10.1 11.0*  RBC 4.96 3.86*  HCT 47.9 37.6*  PLT 230 184    Recent Labs  12/27/13 0840 12/30/13 0501  NA 136* 135*  K 4.6 4.4  CL 100 100  CO2 26 23  BUN 14 11  CREATININE 1.02 1.05  GLUCOSE 88 125*  CALCIUM 9.6 8.2*    Recent Labs  12/27/13 0840  INR 1.01    Sensation intact distally Intact pulses distally Dorsiflexion/Plantar flexion intact Incision: scant drainage Compartment soft  Assessment/Plan: 1 Day Post-Op Procedure(s) (LRB): LEFT TOTAL KNEE ARTHROPLASTY (Left) Up with therapy  Antoniette Peake Y 12/30/2013, 7:55 AM

## 2013-12-31 LAB — CBC
HCT: 34.6 % — ABNORMAL LOW (ref 39.0–52.0)
Hemoglobin: 11.6 g/dL — ABNORMAL LOW (ref 13.0–17.0)
MCH: 32.2 pg (ref 26.0–34.0)
MCHC: 33.5 g/dL (ref 30.0–36.0)
MCV: 96.1 fL (ref 78.0–100.0)
Platelets: 170 10*3/uL (ref 150–400)
RBC: 3.6 MIL/uL — ABNORMAL LOW (ref 4.22–5.81)
RDW: 13.9 % (ref 11.5–15.5)
WBC: 11.7 10*3/uL — ABNORMAL HIGH (ref 4.0–10.5)

## 2013-12-31 MED ORDER — ASPIRIN 325 MG PO TBEC: 325.0000 mg | DELAYED_RELEASE_TABLET | Freq: Two times a day (BID) | ORAL | Status: AC

## 2013-12-31 MED ORDER — OXYCODONE-ACETAMINOPHEN 5-325 MG PO TABS: 1.0000 | ORAL_TABLET | ORAL | Status: AC | PRN

## 2013-12-31 MED ORDER — METHOCARBAMOL 500 MG PO TABS: 500.0000 mg | ORAL_TABLET | Freq: Four times a day (QID) | ORAL | Status: AC | PRN

## 2013-12-31 NOTE — Progress Notes (Signed)
Occupational Therapy Treatment Patient Details Name: Shona SimpsonRonald A Rueter MRN: [id number] DOB: October 25, 1956 Today's Date: 12/31/2013 Time: [id number] OT Time Calculation (min): 11 min  OT Assessment / Plan / Recommendation  History of present illness LTKA   OT comments  Pt/wife have no further education needs from OT.  Will d/c without OT follow up  Follow Up Recommendations  No OT follow up    Barriers to Discharge       Equipment Recommendations  None recommended by OT    Recommendations for Other Services    Frequency     Progress towards OT Goals Progress towards OT goals:  (pt verbalizes tub bench; d/c OT)  Plan      Precautions / Restrictions Precautions Precautions: Knee Knee Immobilizer - Left:  (pt reports he is no longer wearing KI) Restrictions Weight Bearing Restrictions: No   Pertinent Vitals/Pain No c/o pain--premedicated and not weight bearing    ADL  ADL Comments: Wife present and assisted with getting pants over L heel.  Educated on tub transfer bench (which they have borrowed).  Demonstrated technique and supporting LLE over tub ledge.  Pt and wife verbalized, and pt did not feel like he needed to practice this.      OT Diagnosis:    OT Problem List:   OT Treatment Interventions:     OT Goals(current goals can now be found in the care plan section)    Visit Information  Last OT Received On: 12/31/13 Assistance Needed: +1 History of Present Illness: LTKA    Subjective Data      Prior Functioning       Cognition  Cognition Arousal/Alertness: Awake/alert Behavior During Therapy: WFL for tasks assessed/performed Overall Cognitive Status: Within Functional Limits for tasks assessed    Mobility       Exercises      Balance    End of Session OT - End of Session Activity Tolerance: Patient tolerated treatment well Patient left: in chair;with call bell/phone within reach;with family/visitor present CPM Left Knee CPM Left Knee: Off  GO      Joe Tanney 12/31/2013, 11:29 AM Marica OtterMaryellen Reghan Thul, OTR/L [phone] 12/31/2013

## 2013-12-31 NOTE — Progress Notes (Signed)
Subjective: 2 Days Post-Op Procedure(s) (LRB): LEFT TOTAL KNEE ARTHROPLASTY (Left) Patient reports pain as moderate. Wanting to go home.  Objective: Vital signs in last 24 hours: Temp:  [98.4 F (36.9 C)-98.8 F (37.1 C)] 98.8 F (37.1 C) (01/18 0501) Pulse Rate:  [80-85] 83 (01/18 0501) Resp:  [16-17] 17 (01/18 0501) BP: (108-121)/(72-77) 108/73 mmHg (01/18 0501) SpO2:  [90 %-93 %] 90 % (01/18 0501)  Intake/Output from previous day: 01/17 0701 - 01/18 0700 In: 1762.8 [P.O.:480; I.V.:1282.8] Out: 2500 [Urine:2500] Intake/Output this shift:     Recent Labs  12/30/13 0501 12/31/13 0539  HGB 12.5* 11.6*    Recent Labs  12/30/13 0501 12/31/13 0539  WBC 11.0* 11.7*  RBC 3.86* 3.60*  HCT 37.6* 34.6*  PLT 184 170    Recent Labs  12/30/13 0501  NA 135*  K 4.4  CL 100  CO2 23  BUN 11  CREATININE 1.05  GLUCOSE 125*  CALCIUM 8.2*   No results found for this basename: LABPT, INR,  in the last 72 hours  Neurovascular intact Intact pulses distally Dorsiflexion/Plantar flexion intact Incision: scant drainage Compartment soft  Incision benign dressing changed.   Assessment/Plan: 2 Days Post-Op Procedure(s) (LRB): LEFT TOTAL KNEE ARTHROPLASTY (Left) Up with therapy Discharge home with home health  Richardean CanalCLARK, Dartanyon Frankowski 12/31/2013, 9:29 AM

## 2013-12-31 NOTE — Progress Notes (Signed)
Discharged from floor via w/c, wife with pt. No changes in assessment. Luke Meyer  

## 2013-12-31 NOTE — Progress Notes (Signed)
Physical Therapy Treatment Patient Details Name: Luke SimpsonRonald A Meyer MRN: [id number] DOB: October 16, 1956 Today's Date: 12/31/2013 Time: [id number] PT Time Calculation (min): 12 min  PT Assessment / Plan / Recommendation  History of Present Illness LTKA   PT Comments   Ready for DC  Follow Up Recommendations  Home health PT     Does the patient have the potential to tolerate intense rehabilitation     Barriers to Discharge        Equipment Recommendations       Recommendations for Other Services    Frequency     Progress towards PT Goals Progress towards PT goals: Progressing toward goals  Plan Current plan remains appropriate    Precautions / Restrictions Precautions Precautions: Knee   Pertinent Vitals/Pain     Mobility  Transfers Equipment used: Rolling walker (2 wheeled) Sit to Stand: Supervision General transfer comment: cues for UE position/leg position Ambulation/Gait Ambulation/Gait assistance: Supervision Ambulation Distance (Feet): 150 Feet Assistive device: Rolling walker (2 wheeled) Gait Pattern/deviations: Step-to pattern;Step-through pattern;Antalgic General Gait Details: cues for sequence.    Exercises Total Joint Exercises Quad Sets: AROM;Left;10 reps;Supine Heel Slides: AAROM;Left;10 reps;Supine Straight Leg Raises: AAROM;Left;10 reps;Supine Long Arc Quad: AROM;Left;5 reps;Seated Knee Flexion: AROM;Left;5 reps;Seated Goniometric ROM: 15-60   PT Diagnosis:    PT Problem List:   PT Treatment Interventions:     PT Goals (current goals can now be found in the care plan section)    Visit Information  Last PT Received On: 12/31/13 Assistance Needed: +1 History of Present Illness: LTKA    Subjective Data      Cognition       Balance     End of Session PT - End of Session Activity Tolerance: Patient tolerated treatment well Patient left: in chair;with family/visitor present Nurse Communication: Mobility status   GP     Rada HayHill, [name]  Elizabeth 12/31/2013, 4:18 PM

## 2013-12-31 NOTE — Discharge Summary (Signed)
Patient ID: Luke Meyer MRN: [id number] DOB/AGE: 58-Oct-1957 58 y.o.  Admit date: 12/29/2013 Discharge date: 12/31/2013  Admission Diagnoses:  Principal Problem:   Degenerative arthritis of left knee Active Problems:   Status post total knee replacement   Discharge Diagnoses:  Same  Past Medical History  Diagnosis Date  . Arthritis     oa and pain left knee  . Hypothyroidism     Surgeries: Procedure(s): LEFT TOTAL KNEE ARTHROPLASTY on 12/29/2013   Consultants:    Discharged Condition: Improved  Hospital Course: Luke Meyer is an 58 y.o. male who was admitted 12/29/2013 for operative treatment ofDegenerative arthritis of knee. Patient has severe unremitting pain that affects sleep, daily activities, and work/hobbies. After pre-op clearance the patient was taken to the operating room on 12/29/2013 and underwent  Procedure(s): LEFT TOTAL KNEE ARTHROPLASTY.    Patient was given perioperative antibiotics: Anti-infectives   Start     Dose/Rate Route Frequency Ordered Stop   12/30/13 0600  ceFAZolin (ANCEF) IVPB 2 g/50 mL premix     2 g 100 mL/hr over 30 Minutes Intravenous On call to O.R. 12/29/13 1241 12/30/13 0622   12/29/13 2200  ceFAZolin (ANCEF) IVPB 1 g/50 mL premix     1 g 100 mL/hr over 30 Minutes Intravenous Every 6 hours 12/29/13 2025 12/30/13 0422       Patient was given sequential compression devices, early ambulation, and chemoprophylaxis to prevent DVT.  Patient benefited maximally from hospital stay and there were no complications.    Recent vital signs: Patient Vitals for the past 24 hrs:  BP Temp Temp src Pulse Resp SpO2  12/31/13 0501 108/73 mmHg 98.8 F (37.1 C) Oral 83 17 90 %  12/30/13 2112 121/77 mmHg 98.8 F (37.1 C) Oral 80 16 93 %  12/30/13 1600 - - - - 16 93 %  12/30/13 1403 114/72 mmHg 98.4 F (36.9 C) Oral 85 16 93 %  12/30/13 1200 - - - - 16 92 %     Recent laboratory studies:  Recent Labs  12/30/13 0501 12/31/13 0539  WBC  11.0* 11.7*  HGB 12.5* 11.6*  HCT 37.6* 34.6*  PLT 184 170  NA 135*  --   K 4.4  --   CL 100  --   CO2 23  --   BUN 11  --   CREATININE 1.05  --   GLUCOSE 125*  --   CALCIUM 8.2*  --      Discharge Medications:     Medication List    STOP taking these medications       HYDROcodone-acetaminophen 5-325 MG per tablet  Commonly known as:  NORCO/VICODIN      TAKE these medications       aspirin 325 MG EC tablet  Take 1 tablet (325 mg total) by mouth 2 (two) times daily after a meal.     levothyroxine 200 MCG tablet  Commonly known as:  SYNTHROID, LEVOTHROID  Take 200 mcg by mouth daily before breakfast.     methocarbamol 500 MG tablet  Commonly known as:  ROBAXIN  Take 1 tablet (500 mg total) by mouth every 6 (six) hours as needed for muscle spasms.     oxyCODONE-acetaminophen 5-325 MG per tablet  Commonly known as:  ROXICET  Take 1-2 tablets by mouth every 4 (four) hours as needed for severe pain.        Diagnostic Studies: Dg Knee Left Port  12/29/2013   CLINICAL DATA:  Postoperative  knee  EXAM: PORTABLE LEFT KNEE - 1-2 VIEW  COMPARISON:  None.  FINDINGS: There is tricompartment knee prosthesis with components in anticipated position and alignment. There is a surgical drain in the knee. There appears to be expansile change of the posterior tibial shaft proximally as well as mild deformity of the fibula proximally.  IMPRESSION: Anticipated postoperative appearance. Deformity proximal tibia and fibula suggesting prior trauma.   Electronically Signed   By: Esperanza Heiraymond  Rubner M.D.   On: 12/29/2013 18:43    Disposition: to home      Discharge Orders   Future Orders Complete By Expires   Call MD / Call 911  As directed    Comments:     If you experience chest pain or shortness of breath, CALL 911 and be transported to the hospital emergency room.  If you develope a fever above 101 F, pus (white drainage) or increased drainage or redness at the wound, or calf pain, call  your surgeon's office.   Constipation Prevention  As directed    Comments:     Drink plenty of fluids.  Prune juice may be helpful.  You may use a stool softener, such as Colace (over the counter) 100 mg twice a day.  Use MiraLax (over the counter) for constipation as needed.   Diet - low sodium heart healthy  As directed    Discharge instructions  As directed    Comments:     Increase your activities as comfort allows. Expect left knee, leg, and foot swelling; ice and elevation as needed. Pump your feet/ankles a few times every hour during the day-time. You can get your current dressing wet in the shower. You can remove your dressing this coming Wed. 1/21 and start getting your actual incision wet daily; then new dry dressing   Discharge patient  As directed    Increase activity slowly as tolerated  As directed       Follow-up Information   Follow up with Kathryne HitchBLACKMAN,Jenah Vanasten Y, MD In 2 weeks.   Specialty:  Orthopedic Surgery   Contact information:   [address] [place] [address] [phone]        Signed: Kathryne HitchBLACKMAN,Da Michelle Y 12/31/2013, 9:30 AM

## 2014-01-01 ENCOUNTER — Encounter (HOSPITAL_COMMUNITY): Payer: Self-pay | Admitting: Orthopaedic Surgery

## 2014-01-01 NOTE — Progress Notes (Signed)
[facility] Pt states he has DME at home. Isidoro DonningAlesia Nekisha Mcdiarmid RN CCM Case Mgmt phone [phone]

## 2014-08-06 IMAGING — CR DG KNEE 1-2V PORT*L*
1 series · 2 of 2 positions shown · non-contrast
Comparison: None.

CLINICAL DATA: Postoperative knee

EXAM:
PORTABLE LEFT KNEE - 1-2 VIEW

[Series 1: AP · left · 2 of 2 slices shown]
[im 1/2]
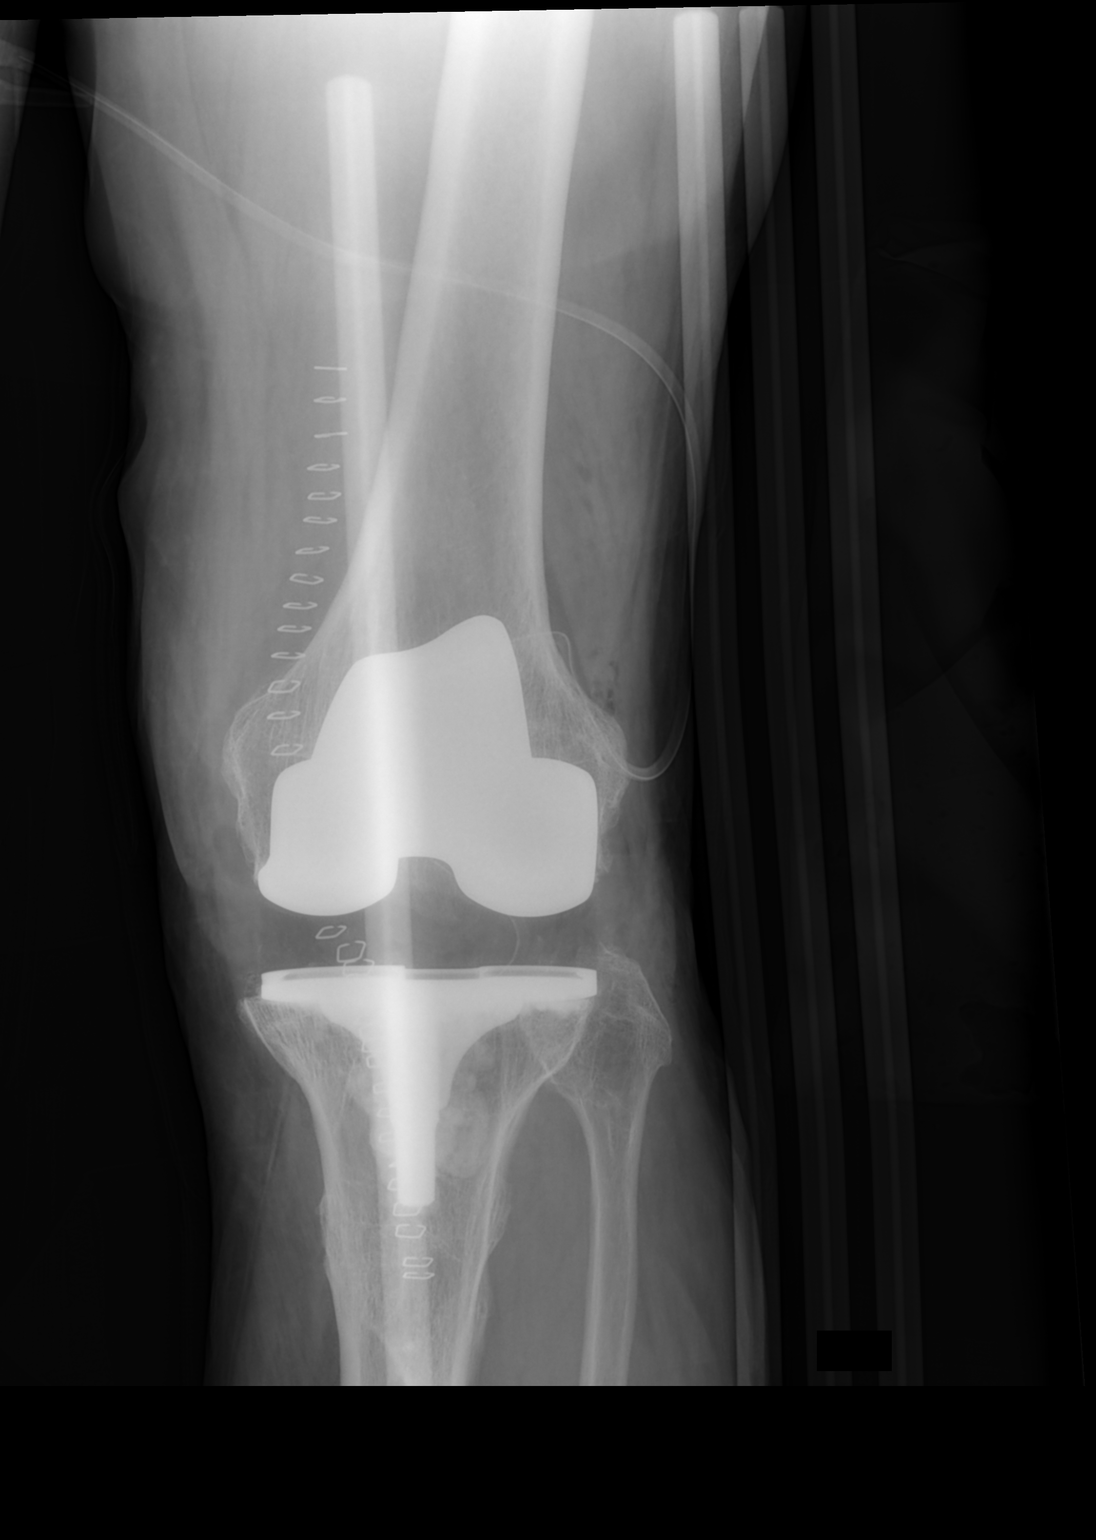
[im 2/2]
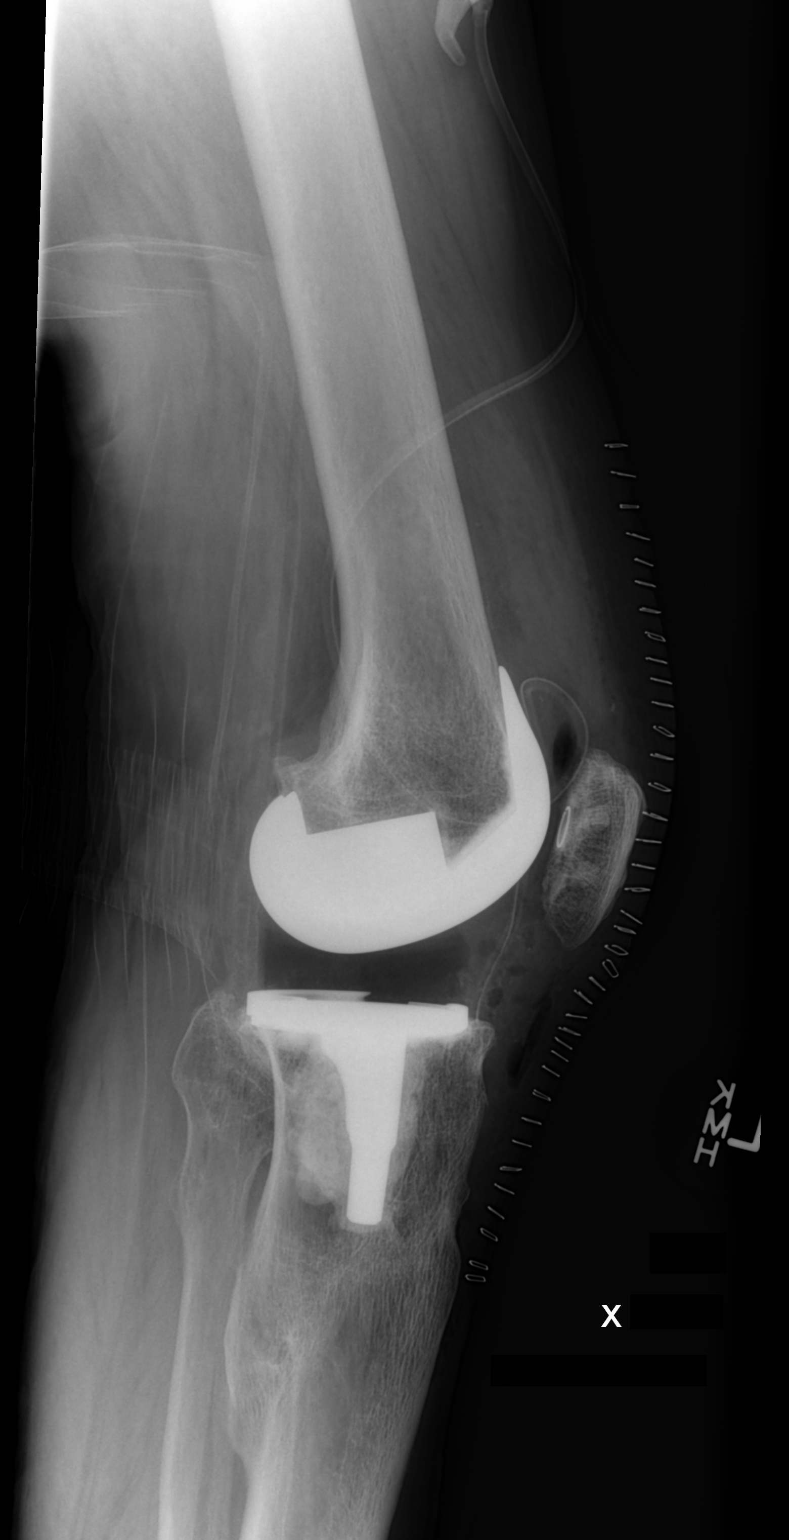

[2 of 2 positions shown; findings below may reference images not displayed]

FINDINGS: There is tricompartment knee prosthesis with components in
anticipated position and alignment. There is a surgical drain in the
knee. There appears to be expansile change of the posterior tibial
shaft proximally as well as mild deformity of the fibula proximally.
IMPRESSION: Anticipated postoperative appearance. Deformity proximal tibia and
fibula suggesting prior trauma.
